# Patient Record
Sex: Female | Born: 1960 | Race: White | Hispanic: No | Marital: Married | Smoking: Never smoker
Health system: Southern US, Community
[De-identification: ages and names within clinical notes are randomized; demographics above are authoritative.]

---

## 2020-06-30 ENCOUNTER — Other Ambulatory Visit: Payer: Self-pay

## 2020-06-30 ENCOUNTER — Emergency Department (HOSPITAL_COMMUNITY)
Admission: EM | Admit: 2020-06-30 | Discharge: 2020-06-30 | Disposition: A | Discharge status: Home / Self Care | Payer: Self-pay | Attending: Emergency Medicine | Admitting: Emergency Medicine

## 2020-06-30 ENCOUNTER — Encounter (HOSPITAL_COMMUNITY): Payer: Self-pay

## 2020-06-30 ENCOUNTER — Emergency Department (HOSPITAL_COMMUNITY): Payer: Self-pay

## 2020-06-30 DIAGNOSIS — R1110 Vomiting, unspecified: Secondary | ICD-10-CM | POA: Insufficient documentation

## 2020-06-30 DIAGNOSIS — R112 Nausea with vomiting, unspecified: Secondary | ICD-10-CM

## 2020-06-30 DIAGNOSIS — E876 Hypokalemia: Secondary | ICD-10-CM | POA: Insufficient documentation

## 2020-06-30 DIAGNOSIS — U071 COVID-19: Secondary | ICD-10-CM | POA: Insufficient documentation

## 2020-06-30 LAB — TROPONIN I (HIGH SENSITIVITY): Troponin I (High Sensitivity): 4 ng/L (ref <18)

## 2020-06-30 LAB — URINALYSIS, ROUTINE W REFLEX MICROSCOPIC
Bacteria, UA: NONE SEEN
Bilirubin Urine: NEGATIVE
Glucose, UA: NEGATIVE mg/dL
Ketones, ur: 20 mg/dL — AB
Leukocytes,Ua: NEGATIVE
Nitrite: NEGATIVE
Protein, ur: NEGATIVE mg/dL
Specific Gravity, Urine: 1.002 — ABNORMAL LOW (ref 1.005–1.030)
pH: 6 (ref 5.0–8.0)

## 2020-06-30 LAB — CBC WITH DIFFERENTIAL/PLATELET
Abs Immature Granulocytes: 0 10*3/uL (ref 0.00–0.07)
Basophils Absolute: 0 10*3/uL (ref 0.0–0.1)
Basophils Relative: 0 %
Eosinophils Absolute: 0 10*3/uL (ref 0.0–0.5)
Eosinophils Relative: 0 %
HCT: 40.7 % (ref 36.0–46.0)
Hemoglobin: 13.8 g/dL (ref 12.0–15.0)
Immature Granulocytes: 0 %
Lymphocytes Relative: 25 %
Lymphs Abs: 0.7 10*3/uL (ref 0.7–4.0)
MCH: 29.4 pg (ref 26.0–34.0)
MCHC: 33.9 g/dL (ref 30.0–36.0)
MCV: 86.8 fL (ref 80.0–100.0)
Monocytes Absolute: 0.3 10*3/uL (ref 0.1–1.0)
Monocytes Relative: 10 %
Neutro Abs: 1.7 10*3/uL (ref 1.7–7.7)
Neutrophils Relative %: 65 %
Platelets: 208 10*3/uL (ref 150–400)
RBC: 4.69 MIL/uL (ref 3.87–5.11)
RDW: 12.1 % (ref 11.5–15.5)
WBC: 2.6 10*3/uL — ABNORMAL LOW (ref 4.0–10.5)
nRBC: 0 % (ref 0.0–0.2)

## 2020-06-30 LAB — COMPREHENSIVE METABOLIC PANEL
ALT: 51 U/L — ABNORMAL HIGH (ref 0–44)
AST: 45 U/L — ABNORMAL HIGH (ref 15–41)
Albumin: 4.3 g/dL (ref 3.5–5.0)
Alkaline Phosphatase: 61 U/L (ref 38–126)
Anion gap: 12 (ref 5–15)
BUN: 10 mg/dL (ref 6–20)
CO2: 25 mmol/L (ref 22–32)
Calcium: 9.1 mg/dL (ref 8.9–10.3)
Chloride: 99 mmol/L (ref 98–111)
Creatinine, Ser: 0.61 mg/dL (ref 0.44–1.00)
GFR, Estimated: >60 mL/min (ref >60)
Glucose, Bld: 102 mg/dL — ABNORMAL HIGH (ref 70–99)
Potassium: 3.1 mmol/L — ABNORMAL LOW (ref 3.5–5.1)
Sodium: 136 mmol/L (ref 135–145)
Total Bilirubin: 0.7 mg/dL (ref 0.3–1.2)
Total Protein: 7.4 g/dL (ref 6.5–8.1)

## 2020-06-30 LAB — LIPASE, BLOOD: Lipase: 21 U/L (ref 11–51)

## 2020-06-30 LAB — MAGNESIUM: Magnesium: 2.1 mg/dL (ref 1.7–2.4)

## 2020-06-30 MED ORDER — PROCHLORPERAZINE EDISYLATE 10 MG/2ML IJ SOLN
10.0000 mg | Freq: Once | INTRAMUSCULAR | Status: AC
  Administered 2020-06-30: 10 mg via INTRAVENOUS
  Filled 2020-06-30: qty 2

## 2020-06-30 MED ORDER — SODIUM CHLORIDE 0.9 % IV BOLUS
1000.0000 mL | Freq: Once | INTRAVENOUS | Status: AC
  Administered 2020-06-30: 1000 mL via INTRAVENOUS

## 2020-06-30 MED ORDER — POTASSIUM CHLORIDE 10 MEQ/100ML IV SOLN
10.0000 meq | Freq: Once | INTRAVENOUS | Status: AC
  Administered 2020-06-30: 10 meq via INTRAVENOUS
  Filled 2020-06-30: qty 100

## 2020-06-30 MED ORDER — ONDANSETRON HCL 4 MG/2ML IJ SOLN
4.0000 mg | Freq: Once | INTRAMUSCULAR | Status: AC
  Administered 2020-06-30: 4 mg via INTRAVENOUS
  Filled 2020-06-30: qty 2

## 2020-06-30 MED ORDER — PROCHLORPERAZINE 25 MG RE SUPP: 25.0000 mg | Freq: Two times a day (BID) | RECTAL | 0 refills | Status: AC | PRN

## 2020-06-30 MED ORDER — DIPHENHYDRAMINE HCL 50 MG/ML IJ SOLN
12.5000 mg | Freq: Once | INTRAMUSCULAR | Status: AC
  Administered 2020-06-30: 12.5 mg via INTRAVENOUS
  Filled 2020-06-30: qty 1

## 2020-06-30 MED ORDER — PROMETHAZINE HCL 25 MG/ML IJ SOLN
12.5000 mg | Freq: Once | INTRAMUSCULAR | Status: AC
  Administered 2020-06-30: 12.5 mg via INTRAVENOUS
  Filled 2020-06-30: qty 1

## 2020-06-30 MED ORDER — PROCHLORPERAZINE MALEATE 10 MG PO TABS: 10.0000 mg | ORAL_TABLET | Freq: Two times a day (BID) | ORAL | 0 refills | Status: AC | PRN

## 2020-06-30 MED ORDER — ALUM & MAG HYDROXIDE-SIMETH 200-200-20 MG/5ML PO SUSP
15.0000 mL | Freq: Once | ORAL | Status: AC
  Administered 2020-06-30: 15 mL via ORAL
  Filled 2020-06-30: qty 30

## 2020-06-30 MED ORDER — POTASSIUM CHLORIDE CRYS ER 20 MEQ PO TBCR
20.0000 meq | EXTENDED_RELEASE_TABLET | Freq: Once | ORAL | Status: DC
  Filled 2020-06-30: qty 1

## 2020-06-30 MED ORDER — POTASSIUM CHLORIDE ER 20 MEQ PO TBCR: 10.0000 meq | EXTENDED_RELEASE_TABLET | Freq: Every day | ORAL | 0 refills | Status: AC

## 2020-06-30 MED ORDER — FAMOTIDINE 20 MG PO TABS: 20.0000 mg | ORAL_TABLET | Freq: Once | ORAL | Status: DC

## 2020-06-30 MED ORDER — METOCLOPRAMIDE HCL 5 MG/ML IJ SOLN: 10.0000 mg | Freq: Once | INTRAMUSCULAR | Status: DC

## 2020-06-30 NOTE — ED Triage Notes (Signed)
Pt reports she started feeling sick Saturday. Took home test on Sunday and was positive. Reports she has been vomiting for 3 days.Vomiting was she drinks

## 2020-06-30 NOTE — ED Notes (Signed)
Given fluids.   

## 2020-06-30 NOTE — ED Notes (Signed)
Not able to tolerate fluid challenge.

## 2020-06-30 NOTE — ED Notes (Signed)

## 2020-06-30 NOTE — ED Provider Notes (Addendum)
[facility] EMERGENCY DEPARTMENT Provider Note   CSN: [id number] Arrival date & time: 06/30/20  1101     History Chief Complaint  Patient presents with  . Covid Positive  . Emesis    Melinda Bryan is a 60 y.o. female with no significant past medical history, presenting with a 3 day history of nearly intractable nausea and vomiting, generalized weakness associated with covid 19.  She is visiting family here, from [place], family here are currently recovering from Covid 19, exposed by a family member MD from [facility]  Pt's initial sx started 5 days ago, including myalgia, neck soreness, sore throat, dry cough and subjective fever. These symptoms have been replaced with nausea and vomiting per above.  She completed a home Covid test 2 days ago and confirmed to be positive.  She denies further fevers, also denies sob, cp, abdominal pain or diarrhea. Her urine production had slowed up so she increased PO intake of fluids, still with vomiting, but has been able to urinate again.  She has tried zofran for sx relief without success.    HPI     History reviewed. No pertinent past medical history.  There are no problems to display for this patient.   History reviewed. No pertinent surgical history.   OB History   No obstetric history on file.     No family history on file.  Social History   Tobacco Use  . Smoking status: Never Smoker  Substance Use Topics  . Alcohol use: Never  . Drug use: Never    Home Medications Prior to Admission medications   Medication Sig Start Date End Date Taking? Authorizing Provider  potassium chloride 20 MEQ TBCR Take 10 mEq by mouth daily for 5 days. 06/30/20 07/05/20 Yes Jaquaveon Bilal, Raynelle Fanning, PA-C  prochlorperazine (COMPAZINE) 10 MG tablet Take 1 tablet (10 mg total) by mouth 2 (two) times daily as needed for nausea or vomiting. 06/30/20  Yes Aine Strycharz, Raynelle Fanning, PA-C  prochlorperazine (COMPAZINE) 25 MG suppository Place 1 suppository (25 mg total) rectally every  12 (twelve) hours as needed for nausea or vomiting. 06/30/20  Yes Breckin Savannah, Raynelle Fanning, PA-C    Allergies    Patient has no known allergies.  Review of Systems   Review of Systems  Constitutional: Positive for fatigue. Negative for chills and fever.  HENT: Negative for congestion and sore throat.   Eyes: Negative.   Respiratory: Negative for chest tightness and shortness of breath.   Cardiovascular: Negative for chest pain.  Gastrointestinal: Positive for nausea and vomiting. Negative for abdominal distention, abdominal pain, constipation and diarrhea.  Genitourinary: Positive for decreased urine volume. Negative for dysuria.  Musculoskeletal: Negative for arthralgias, joint swelling and neck pain.  Skin: Negative.  Negative for rash and wound.  Neurological: Positive for weakness. Negative for dizziness, light-headedness, numbness and headaches.  Psychiatric/Behavioral: Negative.     Physical Exam Updated Vital Signs BP 117/60   Pulse 80   Temp 98.5 F (36.9 C) (Oral)   Resp 17   Ht 5\' 3"  (1.6 m)   Wt 77.1 kg   SpO2 94%   BMI 30.11 kg/m   Physical Exam Vitals and nursing note reviewed.  Constitutional:      General: She is not in acute distress.    Appearance: She is well-developed and well-nourished.  HENT:     Head: Normocephalic and atraumatic.     Mouth/Throat:     Mouth: Mucous membranes are dry.     Pharynx: Oropharynx is clear. No  oropharyngeal exudate or posterior oropharyngeal erythema.  Eyes:     Conjunctiva/sclera: Conjunctivae normal.  Cardiovascular:     Rate and Rhythm: Normal rate and regular rhythm.     Pulses: Intact distal pulses.     Heart sounds: Normal heart sounds.  Pulmonary:     Effort: Pulmonary effort is normal.     Breath sounds: Normal breath sounds. No wheezing.  Abdominal:     General: Bowel sounds are normal. There is no distension.     Palpations: Abdomen is soft.     Tenderness: There is no abdominal tenderness. There is no guarding.   Musculoskeletal:        General: Normal range of motion.     Cervical back: Normal range of motion.  Skin:    General: Skin is warm and dry.  Neurological:     General: No focal deficit present.     Mental Status: She is alert.  Psychiatric:        Mood and Affect: Mood and affect normal.     ED Results / Procedures / Treatments   Labs (all labs ordered are listed, but only abnormal results are displayed) Labs Reviewed  CBC WITH DIFFERENTIAL/PLATELET - Abnormal; Notable for the following components:      Result Value   WBC 2.6 (*)    All other components within normal limits  COMPREHENSIVE METABOLIC PANEL - Abnormal; Notable for the following components:   Potassium 3.1 (*)    Glucose, Bld 102 (*)    AST 45 (*)    ALT 51 (*)    All other components within normal limits  URINALYSIS, ROUTINE W REFLEX MICROSCOPIC - Abnormal; Notable for the following components:   Color, Urine COLORLESS (*)    Specific Gravity, Urine 1.002 (*)    Hgb urine dipstick SMALL (*)    Ketones, ur 20 (*)    All other components within normal limits  LIPASE, BLOOD  MAGNESIUM  TROPONIN I (HIGH SENSITIVITY)    EKG None  Radiology DG Chest Portable 1 View  Result Date: 06/30/2020 CLINICAL DATA:  COVID positive EXAM: PORTABLE CHEST 1 VIEW COMPARISON:  None. FINDINGS: Heart and mediastinal contours are within normal limits. No focal opacities or effusions. No acute bony abnormality. IMPRESSION: No active disease. Electronically Signed   By: Charlett Nose M.D.   On: 06/30/2020 17:53    Procedures Procedures   Medications Ordered in ED Medications  sodium chloride 0.9 % bolus 1,000 mL (0 mLs Intravenous Stopped 06/30/20 1515)  promethazine (PHENERGAN) injection 12.5 mg (12.5 mg Intravenous Given 06/30/20 1415)  potassium chloride 10 mEq in 100 mL IVPB (0 mEq Intravenous Stopped 06/30/20 1655)  ondansetron (ZOFRAN) injection 4 mg (4 mg Intravenous Given 06/30/20 1601)  sodium chloride 0.9 % bolus  1,000 mL (0 mLs Intravenous Stopped 06/30/20 1655)  alum & mag hydroxide-simeth (MAALOX/MYLANTA) 200-200-20 MG/5ML suspension 15 mL (15 mLs Oral Given 06/30/20 1822)  diphenhydrAMINE (BENADRYL) injection 12.5 mg (12.5 mg Intravenous Given 06/30/20 2100)  prochlorperazine (COMPAZINE) injection 10 mg (10 mg Intravenous Given 06/30/20 2059)    ED Course  I have reviewed the triage vital signs and the nursing notes.  Pertinent labs & imaging results that were available during my care of the patient were reviewed by me and considered in my medical decision making (see chart for details).  Clinical Course as of 06/30/20 2250  Tue Jun 30, 2020  1530 Pt given IVF, IV phenergan. She was able to initially tolerate PO intake,  then started vomiting again.  Additional IVF ordered, IV zofran.  She is also hypokalemic at 3.1, IV K+ 10 meq given. [JI]  1605 Pt started vomiting with PO challenge after phenergan given.  Zofran ordered.  EKG obtained by RN who noticed ectopy on monitor, however, pt was actively vomiting at the time.  Suspect artifact, but will continue to monitor.  Mg level added to labs.    [JI]  2249 Pt given compazine and was then able to tolerate PO fluids. No nausea currently.   [JI]    Clinical Course User Index [JI] Burgess Amor, PA-C   MDM Rules/Calculators/A&P                          Pt with covid 19, nausea and uncontrolled vomiting, which was finally controlled with compazine.  Labs, imaging, ekgs reviewed and discussed with pt.  Potassium replaced with IV 10 meq, additional prescribed for completion of this tx.  Both oral and rectal compazine prescribed with strict precautions about use, pt understands to use suppository only if she vomits the tablet.  Return precautions and covid quarantine guidelines outlined.  Melinda Bryan was evaluated in Emergency Department on 06/30/2020 for the symptoms described in the history of present illness. She was evaluated in the context of the  global COVID-19 pandemic, which necessitated consideration that the patient might be at risk for infection with the SARS-CoV-2 virus that causes COVID-19. Institutional protocols and algorithms that pertain to the evaluation of patients at risk for COVID-19 are in a state of rapid change based on information released by regulatory bodies including the CDC and federal and state organizations. These policies and algorithms were followed during the patient's care in the ED.  Final Clinical Impression(s) / ED Diagnoses Final diagnoses:  COVID-19  Non-intractable vomiting with nausea, unspecified vomiting type  Hypokalemia    Rx / DC Orders ED Discharge Orders         Ordered    prochlorperazine (COMPAZINE) 25 MG suppository  Every 12 hours PRN        06/30/20 2240    prochlorperazine (COMPAZINE) 10 MG tablet  2 times daily PRN        06/30/20 2240    potassium chloride 20 MEQ TBCR  Daily        06/30/20 2245           Burgess Amor, PA-C 06/30/20 2253    Burgess Amor, PA-C 06/30/20 2304    Mancel Bale, MD 07/01/20 [phone]

## 2020-06-30 NOTE — ED Notes (Signed)
PA at bedside.

## 2020-06-30 NOTE — ED Notes (Signed)
Lab at bedside at this time.  

## 2020-06-30 NOTE — Discharge Instructions (Addendum)
You are being prescribed 2 forms of Compazine, tablets and rectal suppositories.  Only use 1 or the other medication, not both.  I recommend the tablets, however if you are unable to keep down the tablet, you can use the Compazine suppository in its place.  Make sure you are drinking plenty of fluids, get rest, Tylenol or ibuprofen for body aches and headache.  You will need to maintain home quarantine for total of 7 days from onset of your symptoms, provided your symptoms are improving by day 7 and you are fever free, if not you should add an additional 3 days to your quarantine.

## 2020-06-30 NOTE — ED Notes (Signed)
X Ray at bedside at this time.  

## 2022-09-16 IMAGING — DX DG CHEST 1V PORT
1 series · 1 of 1 positions shown · non-contrast
Comparison: None.

CLINICAL DATA: COVID positive

EXAM:
PORTABLE CHEST 1 VIEW

[chest ap]
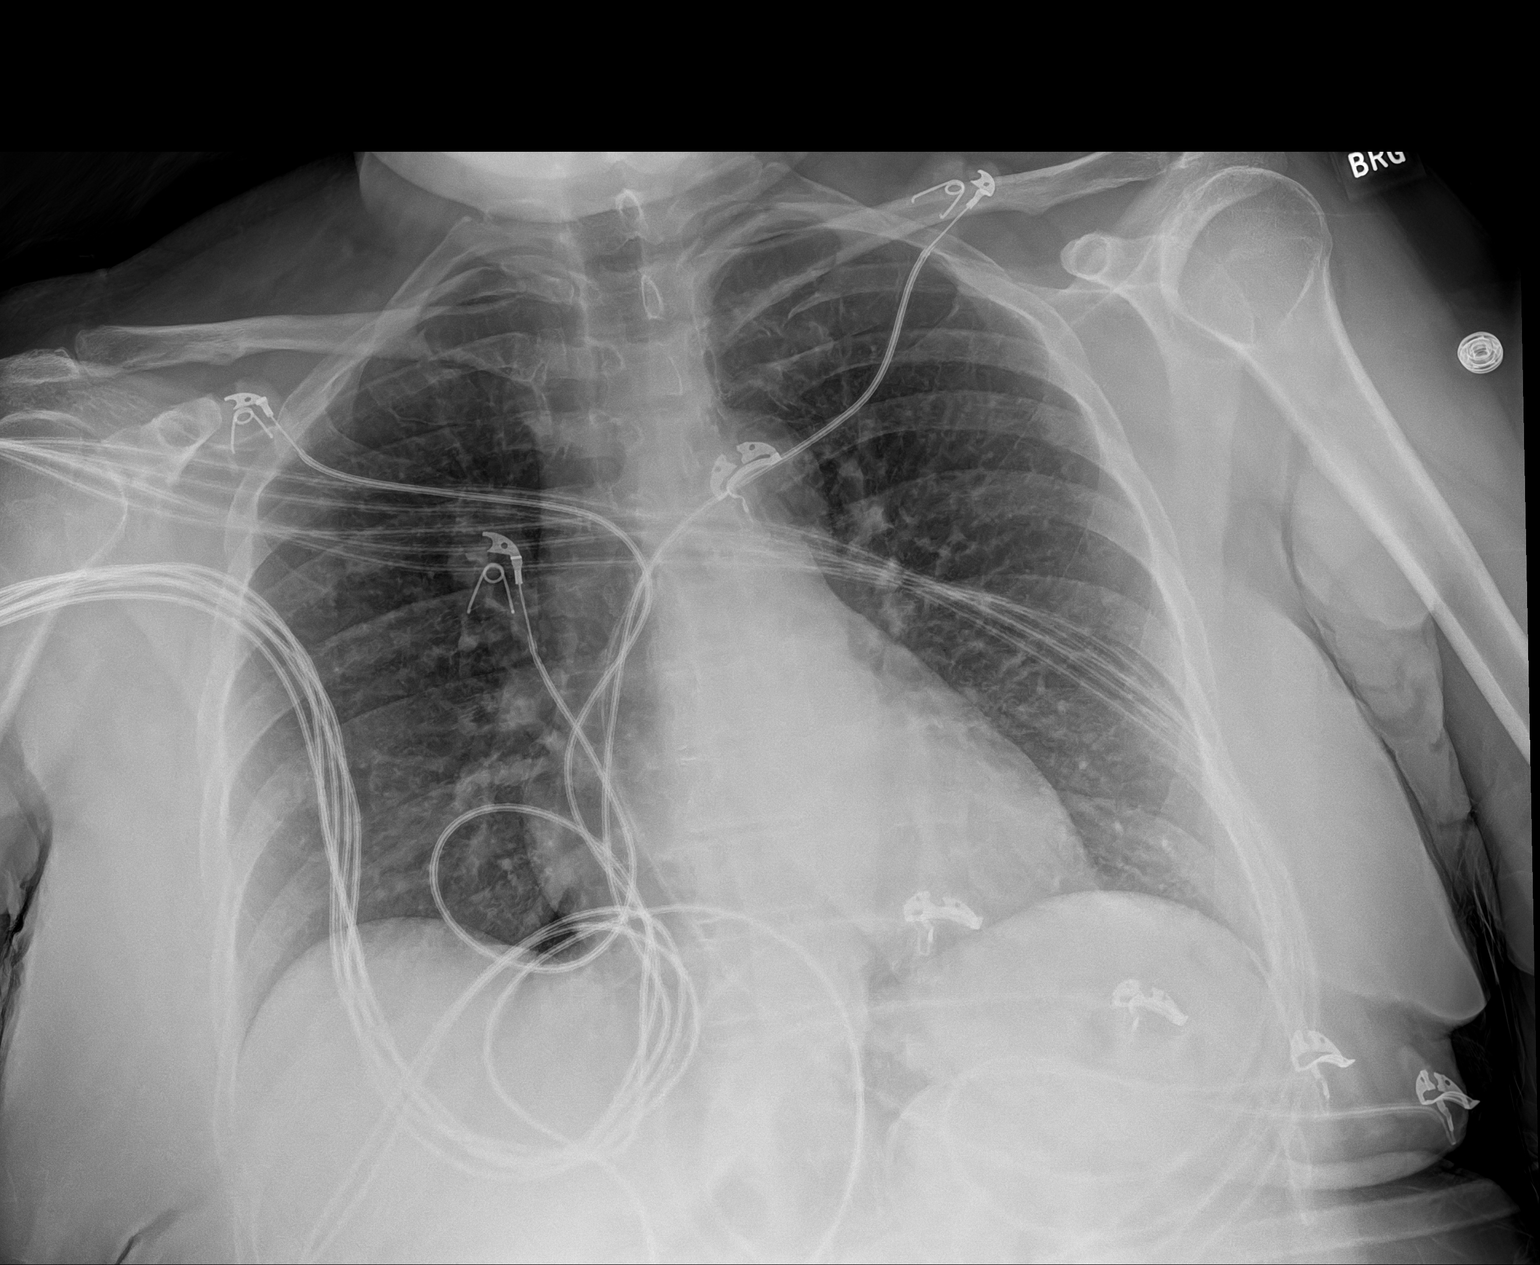

[1 of 1 positions shown; findings below may reference images not displayed]

FINDINGS: Heart and mediastinal contours are within normal limits. No focal
opacities or effusions. No acute bony abnormality.
IMPRESSION: No active disease.
